# Patient Record
Sex: Male | Born: 1970
Health system: Southern US, Community
[De-identification: ages and names within clinical notes are randomized; demographics above are authoritative.]

## PROBLEM LIST (undated history)

## (undated) DIAGNOSIS — Y249XXA Unspecified firearm discharge, undetermined intent, initial encounter: Secondary | ICD-10-CM

## (undated) DIAGNOSIS — I219 Acute myocardial infarction, unspecified: Secondary | ICD-10-CM

## (undated) DIAGNOSIS — I10 Essential (primary) hypertension: Secondary | ICD-10-CM

## (undated) DIAGNOSIS — E119 Type 2 diabetes mellitus without complications: Secondary | ICD-10-CM

## (undated) DIAGNOSIS — W3400XA Accidental discharge from unspecified firearms or gun, initial encounter: Secondary | ICD-10-CM

## (undated) HISTORY — PX: ABDOMINAL SURGERY: SHX537

---

## 2019-08-16 ENCOUNTER — Encounter (HOSPITAL_COMMUNITY): Payer: Self-pay | Admitting: Emergency Medicine

## 2019-08-16 ENCOUNTER — Other Ambulatory Visit: Payer: Self-pay

## 2019-08-16 ENCOUNTER — Emergency Department (HOSPITAL_COMMUNITY)
Admission: EM | Admit: 2019-08-16 | Discharge: 2019-08-16 | Disposition: A | Discharge status: Home / Self Care | Payer: Self-pay | Attending: Emergency Medicine | Admitting: Emergency Medicine

## 2019-08-16 DIAGNOSIS — F17210 Nicotine dependence, cigarettes, uncomplicated: Secondary | ICD-10-CM | POA: Insufficient documentation

## 2019-08-16 DIAGNOSIS — Z79899 Other long term (current) drug therapy: Secondary | ICD-10-CM | POA: Insufficient documentation

## 2019-08-16 DIAGNOSIS — Z7982 Long term (current) use of aspirin: Secondary | ICD-10-CM | POA: Insufficient documentation

## 2019-08-16 DIAGNOSIS — Z760 Encounter for issue of repeat prescription: Secondary | ICD-10-CM | POA: Insufficient documentation

## 2019-08-16 DIAGNOSIS — Z91010 Allergy to peanuts: Secondary | ICD-10-CM | POA: Insufficient documentation

## 2019-08-16 DIAGNOSIS — I10 Essential (primary) hypertension: Secondary | ICD-10-CM | POA: Insufficient documentation

## 2019-08-16 DIAGNOSIS — R55 Syncope and collapse: Secondary | ICD-10-CM | POA: Insufficient documentation

## 2019-08-16 DIAGNOSIS — E119 Type 2 diabetes mellitus without complications: Secondary | ICD-10-CM | POA: Insufficient documentation

## 2019-08-16 DIAGNOSIS — Z7984 Long term (current) use of oral hypoglycemic drugs: Secondary | ICD-10-CM | POA: Insufficient documentation

## 2019-08-16 HISTORY — DX: Essential (primary) hypertension: I10

## 2019-08-16 HISTORY — DX: Type 2 diabetes mellitus without complications: E11.9

## 2019-08-16 LAB — URINALYSIS, ROUTINE W REFLEX MICROSCOPIC
Bilirubin Urine: NEGATIVE
Glucose, UA: NEGATIVE mg/dL
Hgb urine dipstick: NEGATIVE
Ketones, ur: NEGATIVE mg/dL
Leukocytes,Ua: NEGATIVE
Nitrite: NEGATIVE
Protein, ur: NEGATIVE mg/dL
Specific Gravity, Urine: 1.021 (ref 1.005–1.030)
pH: 5 (ref 5.0–8.0)

## 2019-08-16 LAB — BASIC METABOLIC PANEL
Anion gap: 8 (ref 5–15)
BUN: 10 mg/dL (ref 6–20)
CO2: 26 mmol/L (ref 22–32)
Calcium: 9 mg/dL (ref 8.9–10.3)
Chloride: 108 mmol/L (ref 98–111)
Creatinine, Ser: 0.7 mg/dL (ref 0.61–1.24)
GFR calc Af Amer: >60 mL/min (ref >60)
GFR calc non Af Amer: >60 mL/min (ref >60)
Glucose, Bld: 126 mg/dL — ABNORMAL HIGH (ref 70–99)
Potassium: 3.6 mmol/L (ref 3.5–5.1)
Sodium: 142 mmol/L (ref 135–145)

## 2019-08-16 LAB — CBC
HCT: 40.6 % (ref 39.0–52.0)
Hemoglobin: 13.1 g/dL (ref 13.0–17.0)
MCH: 29.1 pg (ref 26.0–34.0)
MCHC: 32.3 g/dL (ref 30.0–36.0)
MCV: 90.2 fL (ref 80.0–100.0)
Platelets: 241 10*3/uL (ref 150–400)
RBC: 4.5 MIL/uL (ref 4.22–5.81)
RDW: 13.2 % (ref 11.5–15.5)
WBC: 5.7 10*3/uL (ref 4.0–10.5)
nRBC: 0 % (ref 0.0–0.2)

## 2019-08-16 LAB — CBG MONITORING, ED: Glucose-Capillary: 129 mg/dL — ABNORMAL HIGH (ref 70–99)

## 2019-08-16 MED ORDER — GLIPIZIDE ER 10 MG PO TB24: 10.0000 mg | ORAL_TABLET | Freq: Every day | ORAL | 0 refills | Status: DC

## 2019-08-16 MED ORDER — METFORMIN HCL 500 MG PO TABS: 1000.0000 mg | ORAL_TABLET | Freq: Two times a day (BID) | ORAL | 0 refills | Status: DC

## 2019-08-16 MED ORDER — LOSARTAN POTASSIUM 25 MG PO TABS: 25.0000 mg | ORAL_TABLET | Freq: Every day | ORAL | 0 refills | Status: DC

## 2019-08-16 MED ORDER — METOPROLOL TARTRATE 25 MG PO TABS: 25.0000 mg | ORAL_TABLET | Freq: Every day | ORAL | 0 refills | Status: DC

## 2019-08-16 MED ORDER — ATORVASTATIN CALCIUM 80 MG PO TABS: 80.0000 mg | ORAL_TABLET | Freq: Every day | ORAL | 0 refills | Status: DC

## 2019-08-16 MED ORDER — SODIUM CHLORIDE 0.9% FLUSH
3.0000 mL | Freq: Once | INTRAVENOUS | Status: AC
  Administered 2019-08-16: 3 mL via INTRAVENOUS

## 2019-08-16 NOTE — Discharge Instructions (Signed)
I have refilled your medications.  Please go to pharmacy to have it refilled.  We have placed you in a MATCH program to help provide discount for your medication. Call and follow up with [facility] for further management of your health and reassessment of your near passing out episodes.  Your labs are normal today. Stay hydrated and take your medications as prescribed.

## 2019-08-16 NOTE — ED Provider Notes (Signed)
[place] DEPT Provider Note   CSN: [id number] Arrival date & time: 08/16/19  2035     History Chief Complaint  Patient presents with  . Near Syncope    Michael Tyler is a 49 y.o. male.  The history is provided by the patient. No language interpreter was used.  Near Syncope     49 year old male with history of hypertension, diabetes, presenting for evaluation of lightheadedness.  Patient report for the past 3 days each time he gets up from a laying to a sitting position he endorsed feeling dizzy.  He described dizziness felt like he is going to pass out.  Symptom lasting for few seconds and resolved however it has been recurrent which caused him noted to be concerned.  He also mentioned that he has been without his diabetic medication including glipizide and Metformin for the past 2 weeks.  He thought his symptoms may be due to his blood sugar.  He has been drinking plenty of fluid.  He denies any sick contact.  No fever or chills no headache, runny nose sneezing or coughing no chest pain or shortness of breath or abdominal pain.  He endorsed occasional lower back pain and states a few days ago he has some runny nose but denies any loss of taste or smell.  He is currently in rehab for alcohol abuse.  He denies any heart palpitation or missing meals.  Past Medical History:  Diagnosis Date  . Diabetes mellitus without complication ([place])   . Hypertension     There are no problems to display for this patient.   History reviewed. No pertinent surgical history.     History reviewed. No pertinent family history.  Social History   Tobacco Use  . Smoking status: Current Every Day Smoker    Types: Cigarettes  . Smokeless tobacco: Never Used  Substance Use Topics  . Alcohol use: Not Currently  . Drug use: Not Currently    Home Medications Prior to Admission medications   Medication Sig Start Date End Date Taking? Authorizing Provider  aspirin  81 MG EC tablet Take by mouth. 07/10/18  Yes [provider]  atorvastatin (LIPITOR) 80 MG tablet Take by mouth. 07/10/18  Yes [provider]  clopidogrel (PLAVIX) 75 MG tablet Take by mouth. 07/10/18  Yes [provider]  glipiZIDE (GLUCOTROL XL) 10 MG 24 hr tablet Take by mouth. 07/10/18  Yes [provider]  losartan (COZAAR) 25 MG tablet Take by mouth. 07/10/18  Yes [provider]  metFORMIN (GLUCOPHAGE-XR) 500 MG 24 hr tablet Take by mouth. 07/10/18  Yes [provider]  metoprolol tartrate (LOPRESSOR) 25 MG tablet Take by mouth. 07/10/18  Yes [provider]  naproxen (NAPROSYN) 500 MG tablet Take by mouth. 02/19/19  Yes [provider]  nitroGLYCERIN (NITROSTAT) 0.4 MG SL tablet Place under the tongue. 07/10/18  Yes [provider]    Allergies    Peanut oil and Penicillins  Review of Systems   Review of Systems  Cardiovascular: Positive for near-syncope.  All other systems reviewed and are negative.   Physical Exam Updated Vital Signs BP 136/86 (BP Location: Left Arm)   Pulse 66   Temp 98 F (36.7 C) (Oral)   Resp 18   Ht 5\' 10"  (1.778 m)   Wt 95.3 kg   SpO2 99%   BMI 30.13 kg/m   Physical Exam Vitals and nursing note reviewed.  Constitutional:      General: He  is not in acute distress.    Appearance: He is well-developed.  HENT:     Head: Atraumatic.  Eyes:     Extraocular Movements: Extraocular movements intact.     Conjunctiva/sclera: Conjunctivae normal.     Pupils: Pupils are equal, round, and reactive to light.  Cardiovascular:     Rate and Rhythm: Normal rate and regular rhythm.     Pulses: Normal pulses.     Heart sounds: Normal heart sounds.  Pulmonary:     Effort: Pulmonary effort is normal.     Breath sounds: Normal breath sounds.  Abdominal:     Palpations: Abdomen is soft.     Tenderness: There is no abdominal tenderness.  Musculoskeletal:     Cervical back:  Normal range of motion and neck supple.  Skin:    Findings: No rash.  Neurological:     Mental Status: He is alert and oriented to person, place, and time.     GCS: GCS eye subscore is 4. GCS verbal subscore is 5. GCS motor subscore is 6.     Motor: Motor function is intact.  Psychiatric:        Mood and Affect: Mood normal.        Speech: Speech normal.        Behavior: Behavior is cooperative.     ED Results / Procedures / Treatments   Labs (all labs ordered are listed, but only abnormal results are displayed) Labs Reviewed  BASIC METABOLIC PANEL - Abnormal; Notable for the following components:      Result Value   Glucose, Bld 126 (*)    All other components within normal limits  CBG MONITORING, ED - Abnormal; Notable for the following components:   Glucose-Capillary 129 (*)    All other components within normal limits  CBC  URINALYSIS, ROUTINE W REFLEX MICROSCOPIC    EKG EKG Interpretation  Date/Time:  Friday August 16 2019 20:56:30 EST Ventricular Rate:  69 PR Interval:    QRS Duration: 104 QT Interval:  427 QTC Calculation: 458 R Axis:   45 Text Interpretation: Sinus rhythm Confirmed by Donnetta Hutching ([id number]) on 08/16/2019 9:46:56 PM    Date: 08/16/2019  Rate: 69  Rhythm: normal sinus rhythm  QRS Axis: normal  Intervals: normal  ST/T Wave abnormalities: normal  Conduction Disutrbances: none  Narrative Interpretation:   Old EKG Reviewed: No significant changes noted     Radiology No results found.  Procedures Procedures (including critical care time)  Medications Ordered in ED Medications  sodium chloride flush (NS) 0.9 % injection 3 mL (3 mLs Intravenous Given by Other 08/16/19 2114)    ED Course  I have reviewed the triage vital signs and the nursing notes.  Pertinent labs & imaging results that were available during my care of the patient were reviewed by me and considered in my medical decision making (see chart for details).    MDM  Rules/Calculators/A&P                      BP 136/86 (BP Location: Left Arm)   Pulse 66   Temp 98 F (36.7 C) (Oral)   Resp 18   Ht 5\' 10"  (1.778 m)   Wt 95.3 kg   SpO2 99%   BMI 30.13 kg/m   Final Clinical Impression(s) / ED Diagnoses Final diagnoses:  Near syncope  Medication refill    Rx / DC Orders ED Discharge Orders  Ordered    atorvastatin (LIPITOR) 80 MG tablet  Daily-1800     08/16/19 2246    glipiZIDE (GLUCOTROL XL) 10 MG 24 hr tablet  Daily with breakfast     08/16/19 2246    losartan (COZAAR) 25 MG tablet  Daily     08/16/19 2246    metFORMIN (GLUCOPHAGE) 500 MG tablet  2 times daily with meals     08/16/19 2246    metoprolol tartrate (LOPRESSOR) 25 MG tablet  Daily     08/16/19 2246         9:39 PM Patient here for complaints of near syncope.  He is usually brought on by positional change.  He is well-appearing, vital signs stable, EKG without concerning arrhythmia or ischemic changes, hemoglobin is within normal limit, UA without signs of urine tract infection, will check orthostatic vital sign.  9:49 PM Patient has normal orthostatic vital sign.  He does not have any Covid symptoms.  He has no significant risk factor for PE.  He does not have any room spinning sensation to suggest BPPV.  He is well-appearing, ambulate without difficulty.  No focal neuro deficit on exam.  Encourage outpatient follow-up for evaluation.  Return precaution discussed.  He did request for medication refill of his diabetic medication.  Patient also report he does not have his Lipitor, Cozaar, and Lopressor.  He does have Plavix.  I have reached out and talk to his case manager who was able to set patient up with community health and wellness for close follow-up as well as helping match with program to get affordable medication.  Patient otherwise stable for discharge.  Return precaution discussed.   Fayrene Helper, PA-C 08/16/19 2310    Donnetta Hutching, MD 08/17/19 [phone]

## 2019-08-16 NOTE — Care Management (Signed)
  MATCH Medication Assistance Card Name: Jabril Pursell  ID (MRN): [id number] Bin: [id number] RX Group: [id number] Discharge Date: 08/16/2019 Expiration Date: 08/31/2019                                           (must be filled within 7 days of discharge)        Dear   : Gwendlyn Deutscher   You have been approved to have the prescriptions written by your discharging physician filled through our [facility] (Medication Assistance Through [facility]) program. This program allows for a one-time (no refills) 34-day supply of selected medications for a low copay amount.  The copay is $3.00 per prescription. For instance, if you have one prescription, you will pay $3.00; for two prescriptions, you pay $6.00; for three prescriptions, you pay $9.00; and so on.  Only certain pharmacies are participating in this program with [facility]. You will need to select one of the pharmacies from the attached list and take your prescriptions, this letter, and your photo ID to one of the participating pharmacies.   We are excited that you are able to use the [facility] program to get your medications. These prescriptions must be filled within 7 days of hospital discharge or they will no longer be valid for the [facility] program. Should you have any problems with your prescriptions please contact your case management team member at [phone] for [place]/ [facility].  Thank you, [facility] Care Management

## 2019-08-16 NOTE — Care Management (Signed)
ED CM consulted for Medication assistance with MATCH.  Patient is uninsured and cannot afford medications. CM enrolled patient, prescriptions were escribed to the [facility] [address].  CM faxed Match letter to [facility] on [address] and notified pharmacy but they were unable to check receipt at this time. CM will follow up with pharmacy in the am

## 2019-08-16 NOTE — ED Triage Notes (Signed)
Patient is complaining of dizziness and feeling woozy. Patient states that it started three days ago. Patient states it is worse when he stands up.

## 2019-08-20 ENCOUNTER — Other Ambulatory Visit: Payer: Self-pay

## 2019-08-20 ENCOUNTER — Encounter (HOSPITAL_COMMUNITY): Payer: Self-pay | Admitting: Emergency Medicine

## 2019-08-20 ENCOUNTER — Emergency Department (HOSPITAL_COMMUNITY)
Admission: EM | Admit: 2019-08-20 | Discharge: 2019-08-20 | Disposition: A | Discharge status: Home / Self Care | Payer: Self-pay | Attending: Emergency Medicine | Admitting: Emergency Medicine

## 2019-08-20 ENCOUNTER — Emergency Department (HOSPITAL_COMMUNITY): Payer: Self-pay

## 2019-08-20 DIAGNOSIS — F17210 Nicotine dependence, cigarettes, uncomplicated: Secondary | ICD-10-CM | POA: Insufficient documentation

## 2019-08-20 DIAGNOSIS — E119 Type 2 diabetes mellitus without complications: Secondary | ICD-10-CM | POA: Insufficient documentation

## 2019-08-20 DIAGNOSIS — R519 Headache, unspecified: Secondary | ICD-10-CM

## 2019-08-20 DIAGNOSIS — U071 COVID-19: Secondary | ICD-10-CM | POA: Insufficient documentation

## 2019-08-20 DIAGNOSIS — I10 Essential (primary) hypertension: Secondary | ICD-10-CM | POA: Insufficient documentation

## 2019-08-20 LAB — CBC WITH DIFFERENTIAL/PLATELET
Abs Immature Granulocytes: 0.01 10*3/uL (ref 0.00–0.07)
Basophils Absolute: 0 10*3/uL (ref 0.0–0.1)
Basophils Relative: 1 %
Eosinophils Absolute: 0.1 10*3/uL (ref 0.0–0.5)
Eosinophils Relative: 2 %
HCT: 41.4 % (ref 39.0–52.0)
Hemoglobin: 13.3 g/dL (ref 13.0–17.0)
Immature Granulocytes: 0 %
Lymphocytes Relative: 43 %
Lymphs Abs: 1.9 10*3/uL (ref 0.7–4.0)
MCH: 28.7 pg (ref 26.0–34.0)
MCHC: 32.1 g/dL (ref 30.0–36.0)
MCV: 89.4 fL (ref 80.0–100.0)
Monocytes Absolute: 0.8 10*3/uL (ref 0.1–1.0)
Monocytes Relative: 18 %
Neutro Abs: 1.6 10*3/uL — ABNORMAL LOW (ref 1.7–7.7)
Neutrophils Relative %: 36 %
Platelets: 204 10*3/uL (ref 150–400)
RBC: 4.63 MIL/uL (ref 4.22–5.81)
RDW: 13.6 % (ref 11.5–15.5)
WBC: 4.3 10*3/uL (ref 4.0–10.5)
nRBC: 0 % (ref 0.0–0.2)

## 2019-08-20 LAB — BASIC METABOLIC PANEL
Anion gap: 6 (ref 5–15)
BUN: 7 mg/dL (ref 6–20)
CO2: 26 mmol/L (ref 22–32)
Calcium: 8.9 mg/dL (ref 8.9–10.3)
Chloride: 109 mmol/L (ref 98–111)
Creatinine, Ser: 0.66 mg/dL (ref 0.61–1.24)
GFR calc Af Amer: >60 mL/min (ref >60)
GFR calc non Af Amer: >60 mL/min (ref >60)
Glucose, Bld: 170 mg/dL — ABNORMAL HIGH (ref 70–99)
Potassium: 3.8 mmol/L (ref 3.5–5.1)
Sodium: 141 mmol/L (ref 135–145)

## 2019-08-20 LAB — POC SARS CORONAVIRUS 2 AG -  ED: SARS Coronavirus 2 Ag: POSITIVE — AB

## 2019-08-20 LAB — TROPONIN I (HIGH SENSITIVITY)
Troponin I (High Sensitivity): <2 ng/L (ref <18)
Troponin I (High Sensitivity): 3 ng/L (ref <18)

## 2019-08-20 NOTE — ED Triage Notes (Signed)
Pt reports that he was exposed to Covid. For couple days having: dry mouth, headache, back pains, dizziness.  Pt adds chest pain

## 2019-08-20 NOTE — Discharge Instructions (Signed)
Person Under Monitoring Name: Michael Tyler  Location: [address] [place] [address]   Infection Prevention Recommendations for Individuals Confirmed to have, or Being Evaluated for, 2019 Novel Coronavirus (COVID-19) Infection Who Receive Care at Home  Individuals who are confirmed to have, or are being evaluated for, COVID-19 should follow the prevention steps below until a healthcare provider or local or state health department says they can return to normal activities.  Stay home except to get medical care You should restrict activities outside your home, except for getting medical care. Do not go to work, school, or public areas, and do not use public transportation or taxis.  Call ahead before visiting your doctor Before your medical appointment, call the healthcare provider and tell them that you have, or are being evaluated for, COVID-19 infection. This will help the healthcare provider's office take steps to keep other people from getting infected. Ask your healthcare provider to call the local or state health department.  Monitor your symptoms Seek prompt medical attention if your illness is worsening (e.g., difficulty breathing). Before going to your medical appointment, call the healthcare provider and tell them that you have, or are being evaluated for, COVID-19 infection. Ask your healthcare provider to call the local or state health department.  Wear a facemask You should wear a facemask that covers your nose and mouth when you are in the same room with other people and when you visit a healthcare provider. People who live with or visit you should also wear a facemask while they are in the same room with you.  Separate yourself from other people in your home As much as possible, you should stay in a different room from other people in your home. Also, you should use a separate bathroom, if available.  Avoid sharing household items You should not  share dishes, drinking glasses, cups, eating utensils, towels, bedding, or other items with other people in your home. After using these items, you should wash them thoroughly with soap and water.  Cover your coughs and sneezes Cover your mouth and nose with a tissue when you cough or sneeze, or you can cough or sneeze into your sleeve. Throw used tissues in a lined trash can, and immediately wash your hands with soap and water for at least 20 seconds or use an alcohol-based hand rub.  Wash your [employer] your hands often and thoroughly with soap and water for at least 20 seconds. You can use an alcohol-based hand sanitizer if soap and water are not available and if your hands are not visibly dirty. Avoid touching your eyes, nose, and mouth with unwashed hands.   Prevention Steps for Caregivers and Household Members of Individuals Confirmed to have, or Being Evaluated for, COVID-19 Infection Being Cared for in the Home  If you live with, or provide care at home for, a person confirmed to have, or being evaluated for, COVID-19 infection please follow these guidelines to prevent infection:  Follow healthcare provider's instructions Make sure that you understand and can help the patient follow any healthcare provider instructions for all care.  Provide for the patient's basic needs You should help the patient with basic needs in the home and provide support for getting groceries, prescriptions, and other personal needs.  Monitor the patient's symptoms If they are getting sicker, call his or her medical provider and tell them that the patient has, or is being evaluated for, COVID-19 infection. This will help the healthcare provider's  office take steps to keep other people from getting infected. Ask the healthcare provider to call the local or state health department.  Limit the number of people who have contact with the patient If possible, have only one caregiver for the  patient. Other household members should stay in another home or place of residence. If this is not possible, they should stay in another room, or be separated from the patient as much as possible. Use a separate bathroom, if available. Restrict visitors who do not have an essential need to be in the home.  Keep older adults, very young children, and other sick people away from the patient Keep older adults, very young children, and those who have compromised immune systems or chronic health conditions away from the patient. This includes people with chronic heart, lung, or kidney conditions, diabetes, and cancer.  Ensure good ventilation Make sure that shared spaces in the home have good air flow, such as from an air conditioner or an opened window, weather permitting.  Wash your hands often Wash your hands often and thoroughly with soap and water for at least 20 seconds. You can use an alcohol based hand sanitizer if soap and water are not available and if your hands are not visibly dirty. Avoid touching your eyes, nose, and mouth with unwashed hands. Use disposable paper towels to dry your hands. If not available, use dedicated cloth towels and replace them when they become wet.  Wear a facemask and gloves Wear a disposable facemask at all times in the room and gloves when you touch or have contact with the patient's blood, body fluids, and/or secretions or excretions, such as sweat, saliva, sputum, nasal mucus, vomit, urine, or feces.  Ensure the mask fits over your nose and mouth tightly, and do not touch it during use. Throw out disposable facemasks and gloves after using them. Do not reuse. Wash your hands immediately after removing your facemask and gloves. If your personal clothing becomes contaminated, carefully remove clothing and launder. Wash your hands after handling contaminated clothing. Place all used disposable facemasks, gloves, and other waste in a lined container before  disposing them with other household waste. Remove gloves and wash your hands immediately after handling these items.  Do not share dishes, glasses, or other household items with the patient Avoid sharing household items. You should not share dishes, drinking glasses, cups, eating utensils, towels, bedding, or other items with a patient who is confirmed to have, or being evaluated for, COVID-19 infection. After the person uses these items, you should wash them thoroughly with soap and water.  Wash laundry thoroughly Immediately remove and wash clothes or bedding that have blood, body fluids, and/or secretions or excretions, such as sweat, saliva, sputum, nasal mucus, vomit, urine, or feces, on them. Wear gloves when handling laundry from the patient. Read and follow directions on labels of laundry or clothing items and detergent. In general, wash and dry with the warmest temperatures recommended on the label.  Clean all areas the individual has used often Clean all touchable surfaces, such as counters, tabletops, doorknobs, bathroom fixtures, toilets, phones, keyboards, tablets, and bedside tables, every day. Also, clean any surfaces that may have blood, body fluids, and/or secretions or excretions on them. Wear gloves when cleaning surfaces the patient has come in contact with. Use a diluted bleach solution (e.g., dilute bleach with 1 part bleach and 10 parts water) or a household disinfectant with a label that says EPA-registered for coronaviruses. To make a  bleach solution at home, add 1 tablespoon of bleach to 1 quart (4 cups) of water. For a larger supply, add  cup of bleach to 1 gallon (16 cups) of water. Read labels of cleaning products and follow recommendations provided on product labels. Labels contain instructions for safe and effective use of the cleaning product including precautions you should take when applying the product, such as wearing gloves or eye protection and making sure you  have good ventilation during use of the product. Remove gloves and wash hands immediately after cleaning.  Monitor yourself for signs and symptoms of illness Caregivers and household members are considered close contacts, should monitor their health, and will be asked to limit movement outside of the home to the extent possible. Follow the monitoring steps for close contacts listed on the symptom monitoring form.   ? If you have additional questions, contact your local health department or call the epidemiologist on call at [phone] (available 24/7). ? This guidance is subject to change. For the most up-to-date guidance from [facility], please refer to their website: YouBlogs.pl

## 2019-08-20 NOTE — ED Provider Notes (Signed)
Emergency Department Provider Note   I have reviewed the triage vital signs and the nursing notes.   HISTORY  Chief Complaint Headache, Dizziness, and Covid exposure   HPI Michael Tyler is a 49 y.o. male with PMH of HTN and DM presents to the emergency department for evaluation after possible COVID-19 exposure.  Patient has developed dry mouth, headache, body aches over the past 2 days.  He was exposed last week to COVID-19 after someone he was in contact with tested positive.  He has developed some diffuse, constant chest pain over the past 2 days as well.  He states that the pain is constant and nonradiating.  He denies shortness of breath.  He has not recorded fever at home.  Past Medical History:  Diagnosis Date  . Diabetes mellitus without complication (HCC)   . Hypertension     There are no problems to display for this patient.   History reviewed. No pertinent surgical history.  Allergies Peanut oil and Penicillins  No family history on file.  Social History Social History   Tobacco Use  . Smoking status: Current Every Day Smoker    Types: Cigarettes  . Smokeless tobacco: Never Used  Substance Use Topics  . Alcohol use: Not Currently  . Drug use: Not Currently    Review of Systems  Constitutional: No fever/chills. Positive fatigue and body aches. Positive dizzy feelings.  Eyes: No visual changes. ENT: No sore throat. Cardiovascular: Positive chest pain. Respiratory: Denies shortness of breath. Positive cough.  Gastrointestinal: No abdominal pain.  No nausea, no vomiting.  No diarrhea.  No constipation. Genitourinary: Negative for dysuria. Musculoskeletal: Negative for back pain. Skin: Negative for rash. Neurological: Negative for focal weakness or numbness. Positive HA.   10-point ROS otherwise negative.  ____________________________________________   PHYSICAL EXAM:  VITAL SIGNS: ED Triage Vitals  Enc Vitals Group     BP 08/20/19 1137 (!)  132/95     Pulse Rate 08/20/19 1137 74     Resp 08/20/19 1137 17     Temp 08/20/19 1137 98.3 F (36.8 C)     Temp Source 08/20/19 1137 Oral     SpO2 08/20/19 1137 99 %   Constitutional: Alert and oriented. Well appearing and in no acute distress. Eyes: Conjunctivae are normal.  Head: Atraumatic. Nose: No congestion/rhinnorhea. Mouth/Throat: Mucous membranes are moist.   Neck: No stridor.  Cardiovascular: Normal rate, regular rhythm. Good peripheral circulation. Grossly normal heart sounds.   Respiratory: Normal respiratory effort.  No retractions. Lungs CTAB. Gastrointestinal: No distention.  Musculoskeletal: No gross deformities of extremities. Neurologic:  Normal speech and language.  Skin:  Skin is warm, dry and intact. No rash noted.   ____________________________________________   LABS (all labs ordered are listed, but only abnormal results are displayed)  Labs Reviewed  BASIC METABOLIC PANEL - Abnormal; Notable for the following components:      Result Value   Glucose, Bld 170 (*)    All other components within normal limits  CBC WITH DIFFERENTIAL/PLATELET - Abnormal; Notable for the following components:   Neutro Abs 1.6 (*)    All other components within normal limits  POC SARS CORONAVIRUS 2 AG -  ED - Abnormal; Notable for the following components:   SARS Coronavirus 2 Ag POSITIVE (*)    All other components within normal limits  TROPONIN I (HIGH SENSITIVITY)  TROPONIN I (HIGH SENSITIVITY)   ____________________________________________  EKG  Rate: 74 PR: 150 QTc: 435  NSR. Narrow QRS. No  ST elevation or depression. Normal T waves. No STEMI.  ____________________________________________  RADIOLOGY  DG Chest 1 View  Result Date: 08/20/2019 CLINICAL DATA:  Headache, dizziness, COVID exposure EXAM: CHEST  1 VIEW COMPARISON:  None. FINDINGS: The heart size and mediastinal contours are within normal limits. Both lungs are clear. No pleural effusion. The  visualized skeletal structures are unremarkable. Metallic fragments overlie the inferior right lateral chest wall. IMPRESSION: No acute process in the chest. Electronically Signed   By: Macy Mis M.D.   On: 08/20/2019 12:00    ____________________________________________   PROCEDURES  Procedure(s) performed:   Procedures  None  ____________________________________________   INITIAL IMPRESSION / ASSESSMENT AND PLAN / ED COURSE  Pertinent labs & imaging results that were available during my care of the patient were reviewed by me and considered in my medical decision making (see chart for details).   Patient presents to the emergency department for evaluation of COVID-19-like symptoms including dry mouth, headache, body aches.  He has developed some chest discomfort which sound more consistent with body aches rather than acute ischemic pain.  Doubt PE clinically.  Vital signs are unremarkable.  No hypoxemia.  EKG reviewed with no ischemic changes. His troponin is negative x1.  Chest x-ray clear.   Plan for quarantine at home and strict ED return precautions.  ____________________________________________  FINAL CLINICAL IMPRESSION(S) / ED DIAGNOSES  Final diagnoses:  COVID-19    Note:  This document was prepared using Dragon voice recognition software and may include unintentional dictation errors.  Nanda Quinton, MD, [facility]    Jasin Brazel, [facility], MD 08/21/19 [phone]

## 2019-08-21 ENCOUNTER — Telehealth: Payer: Self-pay | Admitting: Pulmonary Disease

## 2019-08-21 NOTE — Telephone Encounter (Signed)
08/21/2019    I attempted to reach the patient regarding recent Covid diagnosis as well as opportunity for monoclonal antibody infusion.  It does look like the patient would qualify based off of history of Diabetes (med list has glipizide).  I have left a message with the telephone number: [phone] for the patient to call us back if they are interested.   Coral Ceo, NP

## 2019-10-03 ENCOUNTER — Emergency Department (HOSPITAL_COMMUNITY)
Admission: EM | Admit: 2019-10-03 | Discharge: 2019-10-03 | Disposition: A | Discharge status: Home / Self Care | Payer: Self-pay | Attending: Emergency Medicine | Admitting: Emergency Medicine

## 2019-10-03 ENCOUNTER — Emergency Department (HOSPITAL_COMMUNITY): Payer: Self-pay

## 2019-10-03 ENCOUNTER — Other Ambulatory Visit: Payer: Self-pay

## 2019-10-03 ENCOUNTER — Encounter (HOSPITAL_COMMUNITY): Payer: Self-pay | Admitting: *Deleted

## 2019-10-03 DIAGNOSIS — I10 Essential (primary) hypertension: Secondary | ICD-10-CM | POA: Insufficient documentation

## 2019-10-03 DIAGNOSIS — E119 Type 2 diabetes mellitus without complications: Secondary | ICD-10-CM | POA: Insufficient documentation

## 2019-10-03 DIAGNOSIS — F17210 Nicotine dependence, cigarettes, uncomplicated: Secondary | ICD-10-CM | POA: Insufficient documentation

## 2019-10-03 DIAGNOSIS — Z91010 Allergy to peanuts: Secondary | ICD-10-CM | POA: Insufficient documentation

## 2019-10-03 DIAGNOSIS — M5441 Lumbago with sciatica, right side: Secondary | ICD-10-CM | POA: Insufficient documentation

## 2019-10-03 DIAGNOSIS — I2510 Atherosclerotic heart disease of native coronary artery without angina pectoris: Secondary | ICD-10-CM | POA: Insufficient documentation

## 2019-10-03 LAB — HEMOGLOBIN A1C
Hgb A1c MFr Bld: 8.4 % — ABNORMAL HIGH (ref 4.8–5.6)
Mean Plasma Glucose: 194.38 mg/dL

## 2019-10-03 LAB — I-STAT CHEM 8, ED
BUN: 5 mg/dL — ABNORMAL LOW (ref 6–20)
Calcium, Ion: 1.22 mmol/L (ref 1.15–1.40)
Chloride: 107 mmol/L (ref 98–111)
Creatinine, Ser: 0.7 mg/dL (ref 0.61–1.24)
Glucose, Bld: 137 mg/dL — ABNORMAL HIGH (ref 70–99)
HCT: 38 % — ABNORMAL LOW (ref 39.0–52.0)
Hemoglobin: 12.9 g/dL — ABNORMAL LOW (ref 13.0–17.0)
Potassium: 4 mmol/L (ref 3.5–5.1)
Sodium: 144 mmol/L (ref 135–145)
TCO2: 26 mmol/L (ref 22–32)

## 2019-10-03 MED ORDER — LIDOCAINE 5 % EX PTCH: 1.0000 | MEDICATED_PATCH | CUTANEOUS | 0 refills | Status: DC

## 2019-10-03 MED ORDER — LOSARTAN POTASSIUM 25 MG PO TABS: 25.0000 mg | ORAL_TABLET | Freq: Every day | ORAL | 0 refills | Status: DC

## 2019-10-03 MED ORDER — ATORVASTATIN CALCIUM 80 MG PO TABS: 80.0000 mg | ORAL_TABLET | Freq: Every day | ORAL | 0 refills | Status: DC

## 2019-10-03 MED ORDER — IBUPROFEN 200 MG PO TABS
600.0000 mg | ORAL_TABLET | Freq: Once | ORAL | Status: AC
  Administered 2019-10-03: 600 mg via ORAL
  Filled 2019-10-03: qty 3

## 2019-10-03 MED ORDER — PREDNISONE 10 MG PO TABS: 40.0000 mg | ORAL_TABLET | Freq: Every day | ORAL | 0 refills | Status: DC

## 2019-10-03 MED ORDER — GLIPIZIDE ER 10 MG PO TB24: 10.0000 mg | ORAL_TABLET | Freq: Every day | ORAL | 0 refills | Status: DC

## 2019-10-03 MED ORDER — CLOPIDOGREL BISULFATE 75 MG PO TABS: 75.0000 mg | ORAL_TABLET | Freq: Every day | ORAL | 0 refills | Status: DC

## 2019-10-03 MED ORDER — METFORMIN HCL 500 MG PO TABS: 1000.0000 mg | ORAL_TABLET | Freq: Two times a day (BID) | ORAL | 0 refills | Status: DC

## 2019-10-03 MED ORDER — METOPROLOL TARTRATE 25 MG PO TABS: 25.0000 mg | ORAL_TABLET | Freq: Every day | ORAL | 0 refills | Status: DC

## 2019-10-03 MED ORDER — ACETAMINOPHEN 325 MG PO TABS
650.0000 mg | ORAL_TABLET | Freq: Once | ORAL | Status: AC
  Administered 2019-10-03: 650 mg via ORAL
  Filled 2019-10-03: qty 2

## 2019-10-03 MED ORDER — LIDOCAINE 5 % EX PTCH
1.0000 | MEDICATED_PATCH | CUTANEOUS | Status: DC
  Administered 2019-10-03: 1 via TRANSDERMAL
  Filled 2019-10-03: qty 1

## 2019-10-03 MED ORDER — ASPIRIN 81 MG PO TBEC: 81.0000 mg | DELAYED_RELEASE_TABLET | Freq: Every day | ORAL | 0 refills | Status: DC

## 2019-10-03 MED FILL — ?METOPROLOL TART 25MG TABLE: 25 | 30 days supply | Qty: 30 | Fill #0

## 2019-10-03 MED FILL — glipiZIDE XL 10 MG TB24: 10 | 30 days supply | Qty: 30 | Fill #0

## 2019-10-03 MED FILL — ?LIDOCAINE 5% PATCH: 5 | 30 days supply | Qty: 30 | Fill #0

## 2019-10-03 MED FILL — ?METFORMIN HCL 500MG TABLET: 500 | 30 days supply | Qty: 120 | Fill #0

## 2019-10-03 MED FILL — ?LOSARTAN POTASSIUM 25MG: 25 | 30 days supply | Qty: 30 | Fill #0

## 2019-10-03 MED FILL — ATORVASTATIN 80 MG TABLET: 80 | 30 days supply | Qty: 30 | Fill #0

## 2019-10-03 MED FILL — CLOPIDOGREL 75 MG TABLET: 75 | 30 days supply | Qty: 30 | Fill #0

## 2019-10-03 MED FILL — ?PREDINSONE 10MG TABLETS: 10 | 5 days supply | Qty: 20 | Fill #0
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
## 2019-10-03 NOTE — TOC Initial Note (Addendum)
Transition of Care [facility]) - Initial/Assessment Note    Patient Details  Name: Michael Tyler MRN: [id number] Date of Birth: [birth year]  [facility] CM/SW Contact:    Elliot Cousin, RN Phone Number: [phone] 10/03/2019, 12:20 PM  Clinical Narrative:                  TOC CM spoke to pt and states he is currently living at [facility] [place] for past five months. States he recently started a new job and his funds are low. Appt arranged at [facility] and pt can utilize [facility] pharmacy to pick up his meds for a discounted rate. Explained meds are $4 or $10. Appt arranged for [facility] for 11/05/2019 at 8:50 am. States [facility] [place] will arrange transportation for him to his appt. Provided pt with brochure for [facility] with appt time.        Patient Goals and CMS Choice        Expected Discharge Plan and Services   In-house Referral: Clinical Social Work Discharge Planning Services: CM Consult, Indigent Health Clinic, Medication Assistance, Follow-up appt scheduled                                          Prior Living Arrangements/Services     Patient language and need for interpreter reviewed:: Yes Do you feel safe going back to the place where you live?: Yes      Need for Family Participation in Patient Care: No (Comment) Care giver support system in place?: No (comment)   Criminal Activity/Legal Involvement Pertinent to Current Situation/Hospitalization: No - Comment as needed  Activities of Daily Living      Permission Sought/Granted Permission sought to share information with : Case Manager, PCP, Family Supports Permission granted to share information with : Yes, Verbal Permission Granted  Share Information with NAME: Robbie Lis  Permission granted to share info w AGENCY: [facility]  Permission granted to share info w Relationship: friend  Permission granted to share info w Contact Information: [phone]  Emotional Assessment    Attitude/Demeanor/Rapport: Gracious Affect (typically observed): Accepting Orientation: : Oriented to Self, Oriented to Place, Oriented to Situation, Oriented to  Time Alcohol / Substance Use: Tobacco Use Psych Involvement: No (comment)  Admission diagnosis:  Back Pain There are no problems to display for this patient.  PCP:  Patient, No Pcp Per Pharmacy:   [facility] - [place], [place] - [place] [address] [place] [employer] [place] [address] Phone: [phone] Fax: [phone]     Social Determinants of Health (SDOH) Interventions    Readmission Risk Interventions No flowsheet data found.

## 2019-10-03 NOTE — ED Triage Notes (Signed)
Loer back pain across back for 3 gases, Denies constipation or urinary symptoms, states he has a lot of gas.

## 2019-10-03 NOTE — ED Notes (Signed)
Patient transported to XR. 

## 2019-11-05 ENCOUNTER — Ambulatory Visit: Payer: Self-pay | Admitting: Family Medicine

## 2019-12-05 ENCOUNTER — Emergency Department (HOSPITAL_COMMUNITY): Payer: Self-pay

## 2019-12-05 ENCOUNTER — Emergency Department (HOSPITAL_COMMUNITY)
Admission: EM | Admit: 2019-12-05 | Discharge: 2019-12-05 | Disposition: A | Discharge status: Home / Self Care | Payer: Self-pay | Attending: Emergency Medicine | Admitting: Emergency Medicine

## 2019-12-05 ENCOUNTER — Other Ambulatory Visit: Payer: Self-pay

## 2019-12-05 DIAGNOSIS — R0789 Other chest pain: Secondary | ICD-10-CM | POA: Insufficient documentation

## 2019-12-05 DIAGNOSIS — Z79899 Other long term (current) drug therapy: Secondary | ICD-10-CM | POA: Insufficient documentation

## 2019-12-05 DIAGNOSIS — F17210 Nicotine dependence, cigarettes, uncomplicated: Secondary | ICD-10-CM | POA: Insufficient documentation

## 2019-12-05 DIAGNOSIS — I10 Essential (primary) hypertension: Secondary | ICD-10-CM | POA: Insufficient documentation

## 2019-12-05 DIAGNOSIS — Z7982 Long term (current) use of aspirin: Secondary | ICD-10-CM | POA: Insufficient documentation

## 2019-12-05 DIAGNOSIS — Z7984 Long term (current) use of oral hypoglycemic drugs: Secondary | ICD-10-CM | POA: Insufficient documentation

## 2019-12-05 DIAGNOSIS — Z91010 Allergy to peanuts: Secondary | ICD-10-CM | POA: Insufficient documentation

## 2019-12-05 DIAGNOSIS — R079 Chest pain, unspecified: Secondary | ICD-10-CM

## 2019-12-05 DIAGNOSIS — E119 Type 2 diabetes mellitus without complications: Secondary | ICD-10-CM | POA: Insufficient documentation

## 2019-12-05 LAB — BASIC METABOLIC PANEL
Anion gap: 10 (ref 5–15)
BUN: 10 mg/dL (ref 6–20)
CO2: 25 mmol/L (ref 22–32)
Calcium: 8.6 mg/dL — ABNORMAL LOW (ref 8.9–10.3)
Chloride: 103 mmol/L (ref 98–111)
Creatinine, Ser: 0.87 mg/dL (ref 0.61–1.24)
GFR calc Af Amer: >60 mL/min (ref >60)
GFR calc non Af Amer: >60 mL/min (ref >60)
Glucose, Bld: 370 mg/dL — ABNORMAL HIGH (ref 70–99)
Potassium: 4.1 mmol/L (ref 3.5–5.1)
Sodium: 138 mmol/L (ref 135–145)

## 2019-12-05 LAB — CBC
HCT: 38.6 % — ABNORMAL LOW (ref 39.0–52.0)
Hemoglobin: 12.3 g/dL — ABNORMAL LOW (ref 13.0–17.0)
MCH: 28.7 pg (ref 26.0–34.0)
MCHC: 31.9 g/dL (ref 30.0–36.0)
MCV: 90.2 fL (ref 80.0–100.0)
Platelets: 247 10*3/uL (ref 150–400)
RBC: 4.28 MIL/uL (ref 4.22–5.81)
RDW: 14.6 % (ref 11.5–15.5)
WBC: 6.4 10*3/uL (ref 4.0–10.5)
nRBC: 0 % (ref 0.0–0.2)

## 2019-12-05 LAB — PROTIME-INR
INR: 1 (ref 0.8–1.2)
Prothrombin Time: 12.7 seconds (ref 11.4–15.2)

## 2019-12-05 LAB — TROPONIN I (HIGH SENSITIVITY)
Troponin I (High Sensitivity): 3 ng/L (ref <18)
Troponin I (High Sensitivity): 2 ng/L (ref <18)

## 2019-12-05 MED ORDER — METOPROLOL TARTRATE 25 MG PO TABS: 25.0000 mg | ORAL_TABLET | Freq: Every day | ORAL | 0 refills | Status: DC

## 2019-12-05 MED ORDER — ATORVASTATIN CALCIUM 80 MG PO TABS: 80.0000 mg | ORAL_TABLET | Freq: Every day | ORAL | 0 refills | Status: DC

## 2019-12-05 MED ORDER — CLOPIDOGREL BISULFATE 75 MG PO TABS: 75.0000 mg | ORAL_TABLET | Freq: Every day | ORAL | 0 refills | Status: DC

## 2019-12-05 MED ORDER — ASPIRIN 81 MG PO TBEC: 81.0000 mg | DELAYED_RELEASE_TABLET | Freq: Every day | ORAL | 0 refills | Status: DC

## 2019-12-05 MED ORDER — LOSARTAN POTASSIUM 25 MG PO TABS: 25.0000 mg | ORAL_TABLET | Freq: Every day | ORAL | 0 refills | Status: DC

## 2019-12-05 MED ORDER — GLIPIZIDE ER 10 MG PO TB24: 10.0000 mg | ORAL_TABLET | Freq: Every day | ORAL | 0 refills | Status: DC

## 2019-12-05 MED FILL — glipiZIDE XL 10 MG TB24: 10 | 30 days supply | Qty: 30 | Fill #0

## 2019-12-05 MED FILL — ?METOPROLOL TART 25MG TABLE: 25 | 30 days supply | Qty: 30 | Fill #0

## 2019-12-05 MED FILL — ATORVASTATIN 80 MG TABLET: 80 | 30 days supply | Qty: 30 | Fill #0

## 2019-12-05 MED FILL — ?CLOPIDOGREL 75MG TA: 75 | 30 days supply | Qty: 30 | Fill #0

## 2019-12-05 MED FILL — LOSARTAN POTASSIUM 25 MG TA: 25 | 30 days supply | Qty: 30 | Fill #0
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
## 2019-12-05 NOTE — ED Triage Notes (Signed)
Pt arrives POV from home with complaints of chest tightness for 2 days. Pt reports he has a hx of stent placement. Pt endorses SHOB on exertion. Pt reports he has been taking " heart medication and blood thinners but ran out yesterday"

## 2019-12-05 NOTE — Progress Notes (Addendum)
TOC CM spoke to pt and states he still lives at [facility] [place]. They will assist him with transportation to his appt. Provided pt with [facility] brochure with address. Explained he can utilize the pharmacy to pick up meds for a $4 or $10 copay. He will need to speak to financial counselor to apply for assistance with his meds and PCP appts. Pt has utilized [facility] and also one time free fill at the pharmacy. Explained to pt to apply for assistance with the [facility] financial counselor to receive the [facility] Card for free meds if he qualifies. Appt arranged for 12/18/2019 at 3:10 pm.   Isidoro Donning RN CCM, [facility] ED TOC CM [phone]       12/05/19 1247  TOC ED Mini Assessment  TOC Time spent with patient (minutes): 30  PING Used in TOC Assessment No  Admission or Readmission Diverted Yes  Interventions which prevented an admission or readmission Medication Review;Follow-up medical appointment  What brought you to the Emergency Department?  chest pain  Barriers to Discharge Barriers Resolved  Barrier interventions assistance with meds, appt scheduled  Means of departure Car    12/05/2019 3:30 pm TOC CM call to pt and he did pick up meds. Explained that if he misses his appts more that 2x then the clinic can discharge him from coming. Pt was a no show at last appt. Pt states he will definitely try to make the appt this time. Isidoro Donning RN CCM, [facility] ED TOC CM [phone]

## 2019-12-05 NOTE — Discharge Instructions (Addendum)
[facility] the [facility] for the scheduled appointment with your primary care doctor.  The pharmacy service there has your prescriptions.

## 2019-12-16 NOTE — Progress Notes (Signed)
Patient ID: Michael Tyler, male   DOB: Jul 29, 1971, 49 y.o.   MRN: [id number]   Virtual Visit via Telephone Note  I connected with Alfred Levins on 12/18/19 at  3:10 PM EDT by telephone and verified that I am speaking with the correct person using two identifiers.   I discussed the limitations, risks, security and privacy concerns of performing an evaluation and management service by telephone and the availability of in person appointments. I also discussed with the patient that there may be a patient responsible charge related to this service. The patient expressed understanding and agreed to proceed.  PATIENT visit by telephone virtually in the context of Covid-19 pandemic. Patient location:  Home/halfway house My Location:  [facility] office Persons on the call:  Me and the patient  History of Present Illness: After ED visit 5/6 2021 for CP.  He had an MI 09/2018 and had a stent placed.  At that time he was using drugs.  He is now clean and sober and living in a half way house and trying to get back up to date on medications.  He moved to the half way house from [place].  Does not have insurance.  Needs help with financial assistance.  He has had sharp CP intermittently since his MI/stent placement and occasional SOB.  Denies leg or abdominal swelling.  A1C =8.4 09/2019   HPI He presents for evaluation of chest pain, which is described as "sharp," and onset yesterday, persistent today.  Pain does not wax and wane.  Pain is mild.  Pain is improved since he arrived here.  He is concerned, because he ran out of his cardiac medications, yesterday.  He has relocated to [place], from [place], [place].  He does not have a local cardiologist or PCP.  He states that he had a cardiac stent about 1 year ago, performed in [place], [place].  He states he is currently clean, and is residing in a halfway house.  He smokes cigarettes.  He denies use of any illegal drugs at this time.  He denies  fever, chills, cough, shortness of breath, nausea, vomiting, weakness or dizziness.  He admits to not being compliant with his diet for cardiac protection, and to avoid hypoglycemia.  He is currently taking his Metformin, 1000 mg, twice daily as prescribed.  There are no other known modifying factors.  Critical Interventions-clinical evaluation, laboratory testing, radiography, reassessment, consultation with transition of care team and arrangements made for follow-up primary care visit and a mechanism to get his prescriptions filled.   After These Interventions, the Patient was reevaluated and was found comfortable and nontoxic; evaluation consistent with nonspecific chest pain, with history of coronary disease.  Doubt ACS, PE or pneumonia.  Patient ran out of his medications yesterday, inadvertently, because he does not have a local prescribing physician.  He is stable for discharge with outpatient management.    Observations/Objective:  NAD,  A&Ox3   Assessment and Plan: 1. Hypertension, unspecified type - losartan (COZAAR) 25 MG tablet; Take 1 tablet (25 mg total) by mouth daily.  Dispense: 30 tablet; Refill: 3 - metoprolol tartrate (LOPRESSOR) 25 MG tablet; Take 1 tablet (25 mg total) by mouth daily.  Dispense: 30 tablet; Refill: 2 - Ambulatory referral to Cardiology  2. Chest pain, unspecified type Resolved currently ED if returns/changes/worsens - Ambulatory referral to Cardiology  3. Type 2 diabetes mellitus with hyperglycemia, unspecified whether long term insulin use ([place]) Not controlled but should be improving as he has  resumed his medications - glipiZIDE (GLUCOTROL XL) 10 MG 24 hr tablet; Take 1 tablet (10 mg total) by mouth daily with breakfast.  Dispense: 30 tablet; Refill: 3 - metFORMIN (GLUCOPHAGE) 500 MG tablet; Take 2 tablets (1,000 mg total) by mouth 2 (two) times daily with a meal.  Dispense: 60 tablet; Refill: 3  4. H/O myocardial infarction of inferior wall, greater  than 8 weeks Obtain financial packet - clopidogrel (PLAVIX) 75 MG tablet; Take 1 tablet (75 mg total) by mouth daily.  Dispense: 30 tablet; Refill: 3 - Ambulatory referral to Cardiology  5. Hyperlipidemia, unspecified hyperlipidemia type - atorvastatin (LIPITOR) 80 MG tablet; Take 1 tablet (80 mg total) by mouth daily at 6 PM.  Dispense: 30 tablet; Refill: 3   Follow Up Instructions: Assign PCP in 4-6 weeks   I discussed the assessment and treatment plan with the patient. The patient was provided an opportunity to ask questions and all were answered. The patient agreed with the plan and demonstrated an understanding of the instructions.   The patient was advised to call back or seek an in-person evaluation if the symptoms worsen or if the condition fails to improve as anticipated.  I provided 14 minutes of non-face-to-face time during this encounter.   Georgian Co, PA-C

## 2019-12-18 ENCOUNTER — Other Ambulatory Visit: Payer: Self-pay

## 2019-12-18 ENCOUNTER — Ambulatory Visit: Payer: Self-pay | Attending: Family Medicine | Admitting: Physician Assistant

## 2019-12-18 DIAGNOSIS — Z09 Encounter for follow-up examination after completed treatment for conditions other than malignant neoplasm: Secondary | ICD-10-CM

## 2019-12-18 DIAGNOSIS — Z7984 Long term (current) use of oral hypoglycemic drugs: Secondary | ICD-10-CM

## 2019-12-18 DIAGNOSIS — R079 Chest pain, unspecified: Secondary | ICD-10-CM

## 2019-12-18 DIAGNOSIS — E785 Hyperlipidemia, unspecified: Secondary | ICD-10-CM | POA: Insufficient documentation

## 2019-12-18 DIAGNOSIS — E1165 Type 2 diabetes mellitus with hyperglycemia: Secondary | ICD-10-CM | POA: Insufficient documentation

## 2019-12-18 DIAGNOSIS — I252 Old myocardial infarction: Secondary | ICD-10-CM | POA: Insufficient documentation

## 2019-12-18 DIAGNOSIS — I10 Essential (primary) hypertension: Secondary | ICD-10-CM

## 2019-12-18 MED ORDER — LOSARTAN POTASSIUM 25 MG PO TABS: 25.0000 mg | ORAL_TABLET | Freq: Every day | ORAL | 3 refills | Status: DC

## 2019-12-18 MED ORDER — GLIPIZIDE ER 10 MG PO TB24: 10.0000 mg | ORAL_TABLET | Freq: Every day | ORAL | 3 refills | Status: DC

## 2019-12-18 MED ORDER — METOPROLOL TARTRATE 25 MG PO TABS: 25.0000 mg | ORAL_TABLET | Freq: Every day | ORAL | 2 refills | Status: DC

## 2019-12-18 MED ORDER — ATORVASTATIN CALCIUM 80 MG PO TABS: 80.0000 mg | ORAL_TABLET | Freq: Every day | ORAL | 3 refills | Status: DC

## 2019-12-18 MED ORDER — METFORMIN HCL 500 MG PO TABS: 1000.0000 mg | ORAL_TABLET | Freq: Two times a day (BID) | ORAL | 3 refills | Status: DC

## 2019-12-18 MED ORDER — CLOPIDOGREL BISULFATE 75 MG PO TABS: 75.0000 mg | ORAL_TABLET | Freq: Every day | ORAL | 3 refills | Status: DC

## 2019-12-18 MED FILL — METFORMIN HCL 500 MG TABS: 500 | 30 days supply | Qty: 120 | Fill #0

## 2020-01-10 ENCOUNTER — Ambulatory Visit: Payer: Self-pay | Admitting: Cardiology

## 2020-02-03 NOTE — Progress Notes (Deleted)
Cardiology Office Note:    Date:  02/03/2020   ID:  Michael Tyler, DOB 1971/07/27, MRN [id number]  PCP:  Patient, No Pcp Per  Cardiologist:  No primary care provider on file.  Electrophysiologist:  None   Referring MD: Anders Simmonds, PA-C   No chief complaint on file. ***  History of Present Illness:    Michael Tyler is a 49 y.o. male with a hx of CAD status post stent placement in 09/2018 in [place], [place], polysubstance abuse, tobacco use, T2DM, hypertension who is referred by Georgian Co, PA for evaluation of chest pain.  He had a recent ED visit on 12/05/2019 with chest pain.  High-sensitivity troponins were 3 given 2.  Echocardiogram 05/24/2018 showed LVEF 55%.  Past Medical History:  Diagnosis Date  . Diabetes mellitus without complication (HCC)   . Hypertension     No past surgical history on file.  Current Medications: No outpatient medications have been marked as taking for the 02/04/20 encounter (Appointment) with Little Ishikawa, MD.     Allergies:   Peanut oil and Penicillins   Social History   Socioeconomic History  . Marital status: Single    Spouse name: Not on file  . Number of children: Not on file  . Years of education: Not on file  . Highest education level: Not on file  Occupational History  . Not on file  Tobacco Use  . Smoking status: Current Every Day Smoker    Types: Cigarettes  . Smokeless tobacco: Never Used  Vaping Use  . Vaping Use: Never used  Substance and Sexual Activity  . Alcohol use: Not Currently  . Drug use: Not Currently  . Sexual activity: Not on file  Other Topics Concern  . Not on file  Social History Narrative  . Not on file   Social Determinants of Health   Financial Resource Strain:   . Difficulty of Paying Living Expenses:   Food Insecurity:   . Worried About Programme researcher, broadcasting/film/video in the Last Year:   . Barista in the Last Year:   Transportation Needs:   . Freight forwarder (Medical):   Marland Kitchen  Lack of Transportation (Non-Medical):   Physical Activity:   . Days of Exercise per Week:   . Minutes of Exercise per Session:   Stress:   . Feeling of Stress :   Social Connections:   . Frequency of Communication with Friends and Family:   . Frequency of Social Gatherings with Friends and Family:   . Attends Religious Services:   . Active Member of Clubs or Organizations:   . Attends Banker Meetings:   Marland Kitchen Marital Status:      Family History: The patient's ***family history is not on file.  ROS:   Please see the history of present illness.    *** All other systems reviewed and are negative.  EKGs/Labs/Other Studies Reviewed:    The following studies were reviewed today: ***  EKG:  EKG is *** ordered today.  The ekg ordered today demonstrates ***  Recent Labs: 12/05/2019: BUN 10; Creatinine, Ser 0.87; Hemoglobin 12.3; Platelets 247; Potassium 4.1; Sodium 138  Recent Lipid Panel No results found for: CHOL, TRIG, HDL, CHOLHDL, VLDL, LDLCALC, LDLDIRECT  Physical Exam:    VS:  There were no vitals taken for this visit.    Wt Readings from Last 3 Encounters:  12/05/19 195 lb (88.5 kg)  10/03/19 195 lb (88.5 kg)  08/16/19 210  lb (95.3 kg)     GEN: *** Well nourished, well developed in no acute distress HEENT: Normal NECK: No JVD; No carotid bruits LYMPHATICS: No lymphadenopathy CARDIAC: ***RRR, no murmurs, rubs, gallops RESPIRATORY:  Clear to auscultation without rales, wheezing or rhonchi  ABDOMEN: Soft, non-tender, non-distended MUSCULOSKELETAL:  No edema; No deformity  SKIN: Warm and dry NEUROLOGIC:  Alert and oriented x 3 PSYCHIATRIC:  Normal affect   ASSESSMENT:    No diagnosis found. PLAN:    CAD: Status post PCI in February 2020 in [place]. -Continue aspirin, Plavix, statin -We will obtain records of prior catheterization  Hypertension: On losartan 25 mg daily and metoprolol 25 mg daily  Hyperlipidemia: On atorvastatin 10  mg daily  T2DM: On Metformin and glipizide  RTC in***  Medication Adjustments/Labs and Tests Ordered: Current medicines are reviewed at length with the patient today.  Concerns regarding medicines are outlined above.  No orders of the defined types were placed in this encounter.  No orders of the defined types were placed in this encounter.   There are no Patient Instructions on file for this visit.   Signed, Little Ishikawa, MD  02/03/2020 9:23 PM    [facility]

## 2020-02-04 ENCOUNTER — Ambulatory Visit: Payer: Self-pay | Admitting: Cardiology

## 2020-02-04 ENCOUNTER — Ambulatory Visit: Payer: Self-pay | Admitting: Internal Medicine

## 2020-02-10 ENCOUNTER — Encounter: Payer: Self-pay | Admitting: General Practice

## 2020-02-27 ENCOUNTER — Emergency Department (HOSPITAL_COMMUNITY)
Admission: EM | Admit: 2020-02-27 | Discharge: 2020-02-27 | Disposition: A | Discharge status: Home / Self Care | Payer: Self-pay | Attending: Emergency Medicine | Admitting: Emergency Medicine

## 2020-02-27 ENCOUNTER — Other Ambulatory Visit: Payer: Self-pay

## 2020-02-27 ENCOUNTER — Emergency Department (HOSPITAL_COMMUNITY): Payer: Self-pay

## 2020-02-27 ENCOUNTER — Encounter (HOSPITAL_COMMUNITY): Payer: Self-pay

## 2020-02-27 DIAGNOSIS — E785 Hyperlipidemia, unspecified: Secondary | ICD-10-CM

## 2020-02-27 DIAGNOSIS — I252 Old myocardial infarction: Secondary | ICD-10-CM

## 2020-02-27 DIAGNOSIS — Z7982 Long term (current) use of aspirin: Secondary | ICD-10-CM | POA: Insufficient documentation

## 2020-02-27 DIAGNOSIS — Z91010 Allergy to peanuts: Secondary | ICD-10-CM | POA: Insufficient documentation

## 2020-02-27 DIAGNOSIS — E1165 Type 2 diabetes mellitus with hyperglycemia: Secondary | ICD-10-CM | POA: Insufficient documentation

## 2020-02-27 DIAGNOSIS — R0789 Other chest pain: Secondary | ICD-10-CM | POA: Insufficient documentation

## 2020-02-27 DIAGNOSIS — I10 Essential (primary) hypertension: Secondary | ICD-10-CM | POA: Insufficient documentation

## 2020-02-27 DIAGNOSIS — F17210 Nicotine dependence, cigarettes, uncomplicated: Secondary | ICD-10-CM | POA: Insufficient documentation

## 2020-02-27 DIAGNOSIS — Z79899 Other long term (current) drug therapy: Secondary | ICD-10-CM | POA: Insufficient documentation

## 2020-02-27 DIAGNOSIS — Z7984 Long term (current) use of oral hypoglycemic drugs: Secondary | ICD-10-CM | POA: Insufficient documentation

## 2020-02-27 HISTORY — DX: Accidental discharge from unspecified firearms or gun, initial encounter: W34.00XA

## 2020-02-27 HISTORY — DX: Acute myocardial infarction, unspecified: I21.9

## 2020-02-27 HISTORY — DX: Unspecified firearm discharge, undetermined intent, initial encounter: Y24.9XXA

## 2020-02-27 LAB — BASIC METABOLIC PANEL
Anion gap: 10 (ref 5–15)
BUN: 15 mg/dL (ref 6–20)
CO2: 25 mmol/L (ref 22–32)
Calcium: 8.8 mg/dL — ABNORMAL LOW (ref 8.9–10.3)
Chloride: 106 mmol/L (ref 98–111)
Creatinine, Ser: 0.85 mg/dL (ref 0.61–1.24)
GFR calc Af Amer: >60 mL/min (ref >60)
GFR calc non Af Amer: >60 mL/min (ref >60)
Glucose, Bld: 284 mg/dL — ABNORMAL HIGH (ref 70–99)
Potassium: 3.9 mmol/L (ref 3.5–5.1)
Sodium: 141 mmol/L (ref 135–145)

## 2020-02-27 LAB — CBG MONITORING, ED: Glucose-Capillary: 286 mg/dL — ABNORMAL HIGH (ref 70–99)

## 2020-02-27 LAB — CBC
HCT: 38.4 % — ABNORMAL LOW (ref 39.0–52.0)
Hemoglobin: 11.9 g/dL — ABNORMAL LOW (ref 13.0–17.0)
MCH: 28.5 pg (ref 26.0–34.0)
MCHC: 31 g/dL (ref 30.0–36.0)
MCV: 92.1 fL (ref 80.0–100.0)
Platelets: 240 10*3/uL (ref 150–400)
RBC: 4.17 MIL/uL — ABNORMAL LOW (ref 4.22–5.81)
RDW: 13.6 % (ref 11.5–15.5)
WBC: 8.9 10*3/uL (ref 4.0–10.5)
nRBC: 0 % (ref 0.0–0.2)

## 2020-02-27 LAB — TROPONIN I (HIGH SENSITIVITY)
Troponin I (High Sensitivity): <2 ng/L (ref <18)
Troponin I (High Sensitivity): 2 ng/L (ref <18)

## 2020-02-27 MED ORDER — LACTATED RINGERS IV BOLUS
1000.0000 mL | Freq: Once | INTRAVENOUS | Status: AC
  Administered 2020-02-27: 1000 mL via INTRAVENOUS

## 2020-02-27 MED ORDER — METOPROLOL TARTRATE 25 MG PO TABS: 25.0000 mg | ORAL_TABLET | Freq: Every day | ORAL | 0 refills | Status: DC

## 2020-02-27 MED ORDER — METFORMIN HCL 500 MG PO TABS: 1000.0000 mg | ORAL_TABLET | Freq: Two times a day (BID) | ORAL | 0 refills | Status: DC

## 2020-02-27 MED ORDER — CLOPIDOGREL BISULFATE 75 MG PO TABS: 75.0000 mg | ORAL_TABLET | Freq: Every day | ORAL | 0 refills | Status: AC

## 2020-02-27 MED ORDER — SODIUM CHLORIDE 0.9% FLUSH: 3.0000 mL | Freq: Once | INTRAVENOUS | Status: DC

## 2020-02-27 MED ORDER — LOSARTAN POTASSIUM 25 MG PO TABS: 25.0000 mg | ORAL_TABLET | Freq: Every day | ORAL | 0 refills | Status: DC

## 2020-02-27 MED ORDER — ASPIRIN 81 MG PO TBEC: 81.0000 mg | DELAYED_RELEASE_TABLET | Freq: Every day | ORAL | 0 refills | Status: AC

## 2020-02-27 MED ORDER — GLIPIZIDE ER 10 MG PO TB24: 10.0000 mg | ORAL_TABLET | Freq: Every day | ORAL | 0 refills | Status: DC

## 2020-02-27 MED ORDER — ATORVASTATIN CALCIUM 80 MG PO TABS: 80.0000 mg | ORAL_TABLET | Freq: Every day | ORAL | 0 refills | Status: DC

## 2020-02-27 NOTE — Progress Notes (Addendum)
TOC CM spoke to pt and states he is in the program, [address] [facility] [place] here in [place]. He is established with [facility] and uses that pharmacy for his meds. Will arrange appt with [facility] on tomorrow and call give pt the appt time. Pt will pick up meds tomorrow from [facility]. ED provider updated. Isidoro Donning RN CCM, [facility] ED TOC CM [phone]

## 2020-02-27 NOTE — Discharge Instructions (Addendum)
Thank you for allowing [place] to care for you today! Please follow up with the primary care provider that transitional care discussed with you as well as follow up with your cardiologist. Please take your medications as prescribed.   If you have any worsening of symptoms or new symptoms, please return to your closest ED.
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
## 2020-02-27 NOTE — ED Triage Notes (Signed)
Patient c/o SOB, chest pain, and hypoglycemia since yesterday. Patient's CBG in triage 286.

## 2020-03-03 MED FILL — METFORMIN HCL 500 MG TABS: 500 | 15 days supply | Qty: 60 | Fill #0 | Status: TO

## 2020-03-03 MED FILL — ?METOPROLOL TART 25MG TABLE: 25 | 30 days supply | Qty: 30 | Fill #0 | Status: TO

## 2020-03-03 MED FILL — ?CLOPIDOGREL 75MG TA: 75 | 30 days supply | Qty: 30 | Fill #0 | Status: TO

## 2020-03-03 MED FILL — LOSARTAN POTASSIUM 25 MG TA: 25 | 30 days supply | Qty: 30 | Fill #0 | Status: TO

## 2020-03-03 MED FILL — ATORVASTATIN 80 MG TABLET: 80 | 30 days supply | Qty: 30 | Fill #0 | Status: TO

## 2020-03-03 MED FILL — glipiZIDE XL 10 MG TB24: 10 | 30 days supply | Qty: 30 | Fill #0 | Status: TO

## 2020-04-08 ENCOUNTER — Ambulatory Visit (INDEPENDENT_AMBULATORY_CARE_PROVIDER_SITE_OTHER): Payer: Self-pay | Admitting: Primary Care

## 2020-04-24 ENCOUNTER — Other Ambulatory Visit (INDEPENDENT_AMBULATORY_CARE_PROVIDER_SITE_OTHER): Payer: Self-pay | Admitting: Primary Care

## 2020-04-24 ENCOUNTER — Encounter (INDEPENDENT_AMBULATORY_CARE_PROVIDER_SITE_OTHER): Payer: Self-pay | Admitting: Primary Care

## 2020-04-24 ENCOUNTER — Other Ambulatory Visit: Payer: Self-pay

## 2020-04-24 ENCOUNTER — Ambulatory Visit (INDEPENDENT_AMBULATORY_CARE_PROVIDER_SITE_OTHER): Payer: Self-pay | Admitting: Primary Care

## 2020-04-24 VITALS — BP 143/87 | HR 63 | Temp 97.5°F | Ht 70.0 in | Wt 200.8 lb

## 2020-04-24 DIAGNOSIS — I10 Essential (primary) hypertension: Secondary | ICD-10-CM

## 2020-04-24 DIAGNOSIS — E785 Hyperlipidemia, unspecified: Secondary | ICD-10-CM

## 2020-04-24 DIAGNOSIS — E1165 Type 2 diabetes mellitus with hyperglycemia: Secondary | ICD-10-CM

## 2020-04-24 LAB — POCT GLYCOSYLATED HEMOGLOBIN (HGB A1C): Hemoglobin A1C: 8.5 % — AB (ref 4.0–5.6)

## 2020-04-24 LAB — GLUCOSE, POCT (MANUAL RESULT ENTRY): POC Glucose: 156 mg/dl — AB (ref 70–99)

## 2020-04-24 MED ORDER — METOPROLOL TARTRATE 25 MG PO TABS
25.0000 mg | ORAL_TABLET | Freq: Every day | ORAL | 1 refills | Status: AC
  Filled 2021-01-20: qty 90, 90d supply, fill #0

## 2020-04-24 MED ORDER — METFORMIN HCL 1000 MG PO TABS
1000.0000 mg | ORAL_TABLET | Freq: Two times a day (BID) | ORAL | 1 refills | Status: AC
Start: 2020-04-24 — End: ?
  Filled 2021-01-21: qty 180, 90d supply, fill #0

## 2020-04-24 MED ORDER — LOSARTAN POTASSIUM 25 MG PO TABS
25.0000 mg | ORAL_TABLET | Freq: Every day | ORAL | 1 refills | Status: AC
  Filled 2021-01-20: qty 90, 90d supply, fill #0

## 2020-04-24 MED ORDER — GLIPIZIDE 10 MG PO TABS
10.0000 mg | ORAL_TABLET | Freq: Two times a day (BID) | ORAL | 1 refills | Status: AC
  Filled 2021-01-20: qty 180, 90d supply, fill #0

## 2020-04-24 MED ORDER — ATORVASTATIN CALCIUM 80 MG PO TABS: 80.0000 mg | ORAL_TABLET | Freq: Every day | ORAL | 0 refills | Status: AC

## 2020-04-24 MED FILL — METFORMIN HCL 1000 MG TABS: 1000 | 90 days supply | Qty: 180 | Fill #0

## 2020-04-24 MED FILL — LOSARTAN POTASSIUM 25 MG TA: 25 | 90 days supply | Qty: 90 | Fill #0

## 2020-04-24 MED FILL — METOPROLOL TARTRATE 25 MG T: 25 | 90 days supply | Qty: 90 | Fill #0

## 2020-04-24 MED FILL — ATORVASTATIN CALCIUM 80 MG: 80 | 90 days supply | Qty: 90 | Fill #0

## 2020-04-24 MED FILL — glipiZIDE 10 MG TABS: 10 | 90 days supply | Qty: 180 | Fill #0

## 2020-04-24 NOTE — Telephone Encounter (Signed)
Medication Refill - Medication: Atorvastatin 80 mg 90 days  Has the patient contacted their pharmacy? Yes.   (Agent: If no, request that the patient contact the pharmacy for the refill.) (Agent: If yes, when and what did the pharmacy advise?)  Preferred Pharmacy (with phone number or street name): [facility]  Agent: Please be advised that RX refills may take up to 3 business days. We ask that you follow-up with your pharmacy.
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
## 2020-04-24 NOTE — Patient Instructions (Signed)
Diabetes Mellitus and Exercise Exercising regularly is important for your overall health, especially when you have diabetes (diabetes mellitus). Exercising is not only about losing weight. It has many other health benefits, such as increasing muscle strength and bone density and reducing body fat and stress. This leads to improved fitness, flexibility, and endurance, all of which result in better overall health. Exercise has additional benefits for people with diabetes, including:  Reducing appetite.  Helping to lower and control blood glucose.  Lowering blood pressure.  Helping to control amounts of fatty substances (lipids) in the blood, such as cholesterol and triglycerides.  Helping the body to respond better to insulin (improving insulin sensitivity).  Reducing how much insulin the body needs.  Decreasing the risk for heart disease by: ? Lowering cholesterol and triglyceride levels. ? Increasing the levels of good cholesterol. ? Lowering blood glucose levels. What is my activity plan? Your health care provider or certified diabetes educator can help you make a plan for the type and frequency of exercise (activity plan) that works for you. Make sure that you:  Do at least 150 minutes of moderate-intensity or vigorous-intensity exercise each week. This could be brisk walking, biking, or water aerobics. ? Do stretching and strength exercises, such as yoga or weightlifting, at least 2 times a week. ? Spread out your activity over at least 3 days of the week.  Get some form of physical activity every day. ? Do not go more than 2 days in a row without some kind of physical activity. ? Avoid being inactive for more than 30 minutes at a time. Take frequent breaks to walk or stretch.  Choose a type of exercise or activity that you enjoy, and set realistic goals.  Start slowly, and gradually increase the intensity of your exercise over time. What do I need to know about managing my  diabetes?   Check your blood glucose before and after exercising. ? If your blood glucose is 240 mg/dL (13.3 mmol/L) or higher before you exercise, check your urine for ketones. If you have ketones in your urine, do not exercise until your blood glucose returns to normal. ? If your blood glucose is 100 mg/dL (5.6 mmol/L) or lower, eat a snack containing 15-20 grams of carbohydrate. Check your blood glucose 15 minutes after the snack to make sure that your level is above 100 mg/dL (5.6 mmol/L) before you start your exercise.  Know the symptoms of low blood glucose (hypoglycemia) and how to treat it. Your risk for hypoglycemia increases during and after exercise. Common symptoms of hypoglycemia can include: ? Hunger. ? Anxiety. ? Sweating and feeling clammy. ? Confusion. ? Dizziness or feeling light-headed. ? Increased heart rate or palpitations. ? Blurry vision. ? Tingling or numbness around the mouth, lips, or tongue. ? Tremors or shakes. ? Irritability.  Keep a rapid-acting carbohydrate snack available before, during, and after exercise to help prevent or treat hypoglycemia.  Avoid injecting insulin into areas of the body that are going to be exercised. For example, avoid injecting insulin into: ? The arms, when playing tennis. ? The legs, when jogging.  Keep records of your exercise habits. Doing this can help you and your health care provider adjust your diabetes management plan as needed. Write down: ? Food that you eat before and after you exercise. ? Blood glucose levels before and after you exercise. ? The type and amount of exercise you have done. ? When your insulin is expected to peak, if you use   insulin. Avoid exercising at times when your insulin is peaking.  When you start a new exercise or activity, work with your health care provider to make sure the activity is safe for you, and to adjust your insulin, medicines, or food intake as needed.  Drink plenty of water while  you exercise to prevent dehydration or heat stroke. Drink enough fluid to keep your urine clear or pale yellow. Summary  Exercising regularly is important for your overall health, especially when you have diabetes (diabetes mellitus).  Exercising has many health benefits, such as increasing muscle strength and bone density and reducing body fat and stress.  Your health care provider or certified diabetes educator can help you make a plan for the type and frequency of exercise (activity plan) that works for you.  When you start a new exercise or activity, work with your health care provider to make sure the activity is safe for you, and to adjust your insulin, medicines, or food intake as needed. This information is not intended to replace advice given to you by your health care provider. Make sure you discuss any questions you have with your health care provider. Document Revised: 02/09/2017 Document Reviewed: 12/28/2015 Elsevier Patient Education  2020 [employer]  

## 2020-06-29 ENCOUNTER — Ambulatory Visit (INDEPENDENT_AMBULATORY_CARE_PROVIDER_SITE_OTHER): Payer: Self-pay | Admitting: Primary Care

## 2020-09-09 ENCOUNTER — Emergency Department (HOSPITAL_COMMUNITY)
Admission: EM | Admit: 2020-09-09 | Discharge: 2020-09-09 | Disposition: A | Discharge status: Home / Self Care | Payer: Self-pay | Attending: Emergency Medicine | Admitting: Emergency Medicine

## 2020-09-09 ENCOUNTER — Other Ambulatory Visit: Payer: Self-pay

## 2020-09-09 ENCOUNTER — Encounter (HOSPITAL_COMMUNITY): Payer: Self-pay

## 2020-09-09 DIAGNOSIS — X500XXA Overexertion from strenuous movement or load, initial encounter: Secondary | ICD-10-CM | POA: Insufficient documentation

## 2020-09-09 DIAGNOSIS — Z79899 Other long term (current) drug therapy: Secondary | ICD-10-CM | POA: Insufficient documentation

## 2020-09-09 DIAGNOSIS — F17210 Nicotine dependence, cigarettes, uncomplicated: Secondary | ICD-10-CM | POA: Insufficient documentation

## 2020-09-09 DIAGNOSIS — E119 Type 2 diabetes mellitus without complications: Secondary | ICD-10-CM | POA: Insufficient documentation

## 2020-09-09 DIAGNOSIS — Z7984 Long term (current) use of oral hypoglycemic drugs: Secondary | ICD-10-CM | POA: Insufficient documentation

## 2020-09-09 DIAGNOSIS — Y990 Civilian activity done for income or pay: Secondary | ICD-10-CM | POA: Insufficient documentation

## 2020-09-09 DIAGNOSIS — Z91010 Allergy to peanuts: Secondary | ICD-10-CM | POA: Insufficient documentation

## 2020-09-09 DIAGNOSIS — I10 Essential (primary) hypertension: Secondary | ICD-10-CM | POA: Insufficient documentation

## 2020-09-09 DIAGNOSIS — M545 Low back pain, unspecified: Secondary | ICD-10-CM | POA: Insufficient documentation

## 2020-09-09 MED ORDER — TIZANIDINE HCL 4 MG PO TABS: 4.0000 mg | ORAL_TABLET | Freq: Four times a day (QID) | ORAL | 0 refills | Status: AC | PRN

## 2020-09-09 MED ORDER — ACETAMINOPHEN 325 MG PO TABS
650.0000 mg | ORAL_TABLET | Freq: Once | ORAL | Status: AC
  Administered 2020-09-09: 650 mg via ORAL
  Filled 2020-09-09: qty 2

## 2020-09-09 MED ORDER — LIDOCAINE 4 % EX PTCH: MEDICATED_PATCH | CUTANEOUS | 0 refills | Status: AC
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
## 2020-09-09 NOTE — ED Triage Notes (Signed)
Pt presents with c/o back injury. Pt reports that he feels like he pulled a muscle in his back at work yesterday. Pt ambulatory to triage.

## 2020-09-09 NOTE — Discharge Instructions (Signed)
Call your primary care doctor or specialist as discussed in the next 2-3 days.   Return immediately back to the ER if:  Your symptoms worsen within the next 12-24 hours. You develop new symptoms such as new fevers, persistent vomiting, new pain, shortness of breath, or new weakness or numbness, or if you have any other concerns.  

## 2021-01-20 ENCOUNTER — Other Ambulatory Visit: Payer: Self-pay

## 2021-01-21 ENCOUNTER — Other Ambulatory Visit: Payer: Self-pay

## 2021-01-26 ENCOUNTER — Other Ambulatory Visit: Payer: Self-pay

## 2021-03-29 ENCOUNTER — Other Ambulatory Visit (HOSPITAL_COMMUNITY): Payer: Self-pay

## 2022-01-20 IMAGING — CR DG CHEST 2V
2 series · 2 of 2 positions shown · non-contrast
Comparison: 12/05/2019

CLINICAL DATA: Chest pain and shortness of breath.

EXAM:
CHEST - 2 VIEW

[w chest pa]
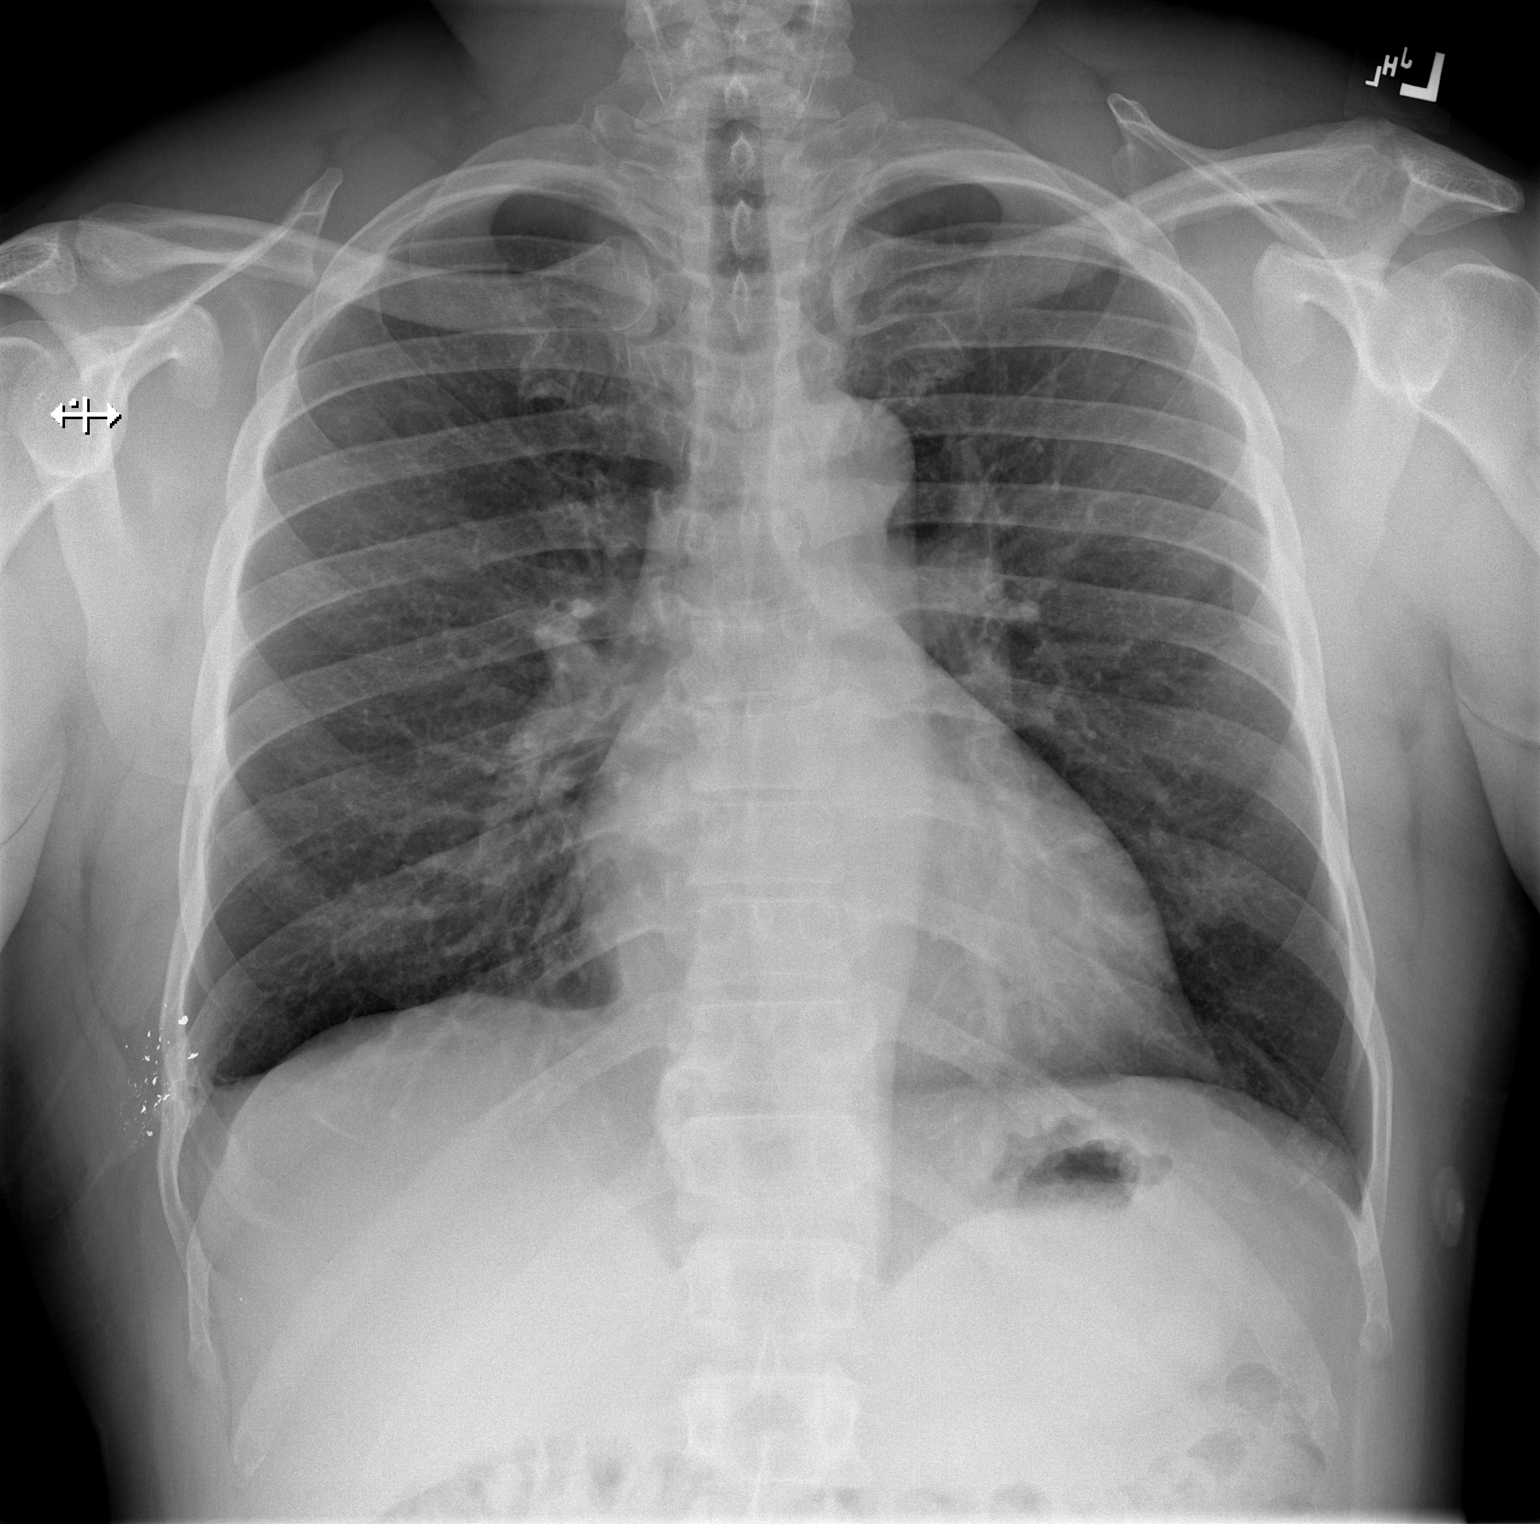

[w chest lat]
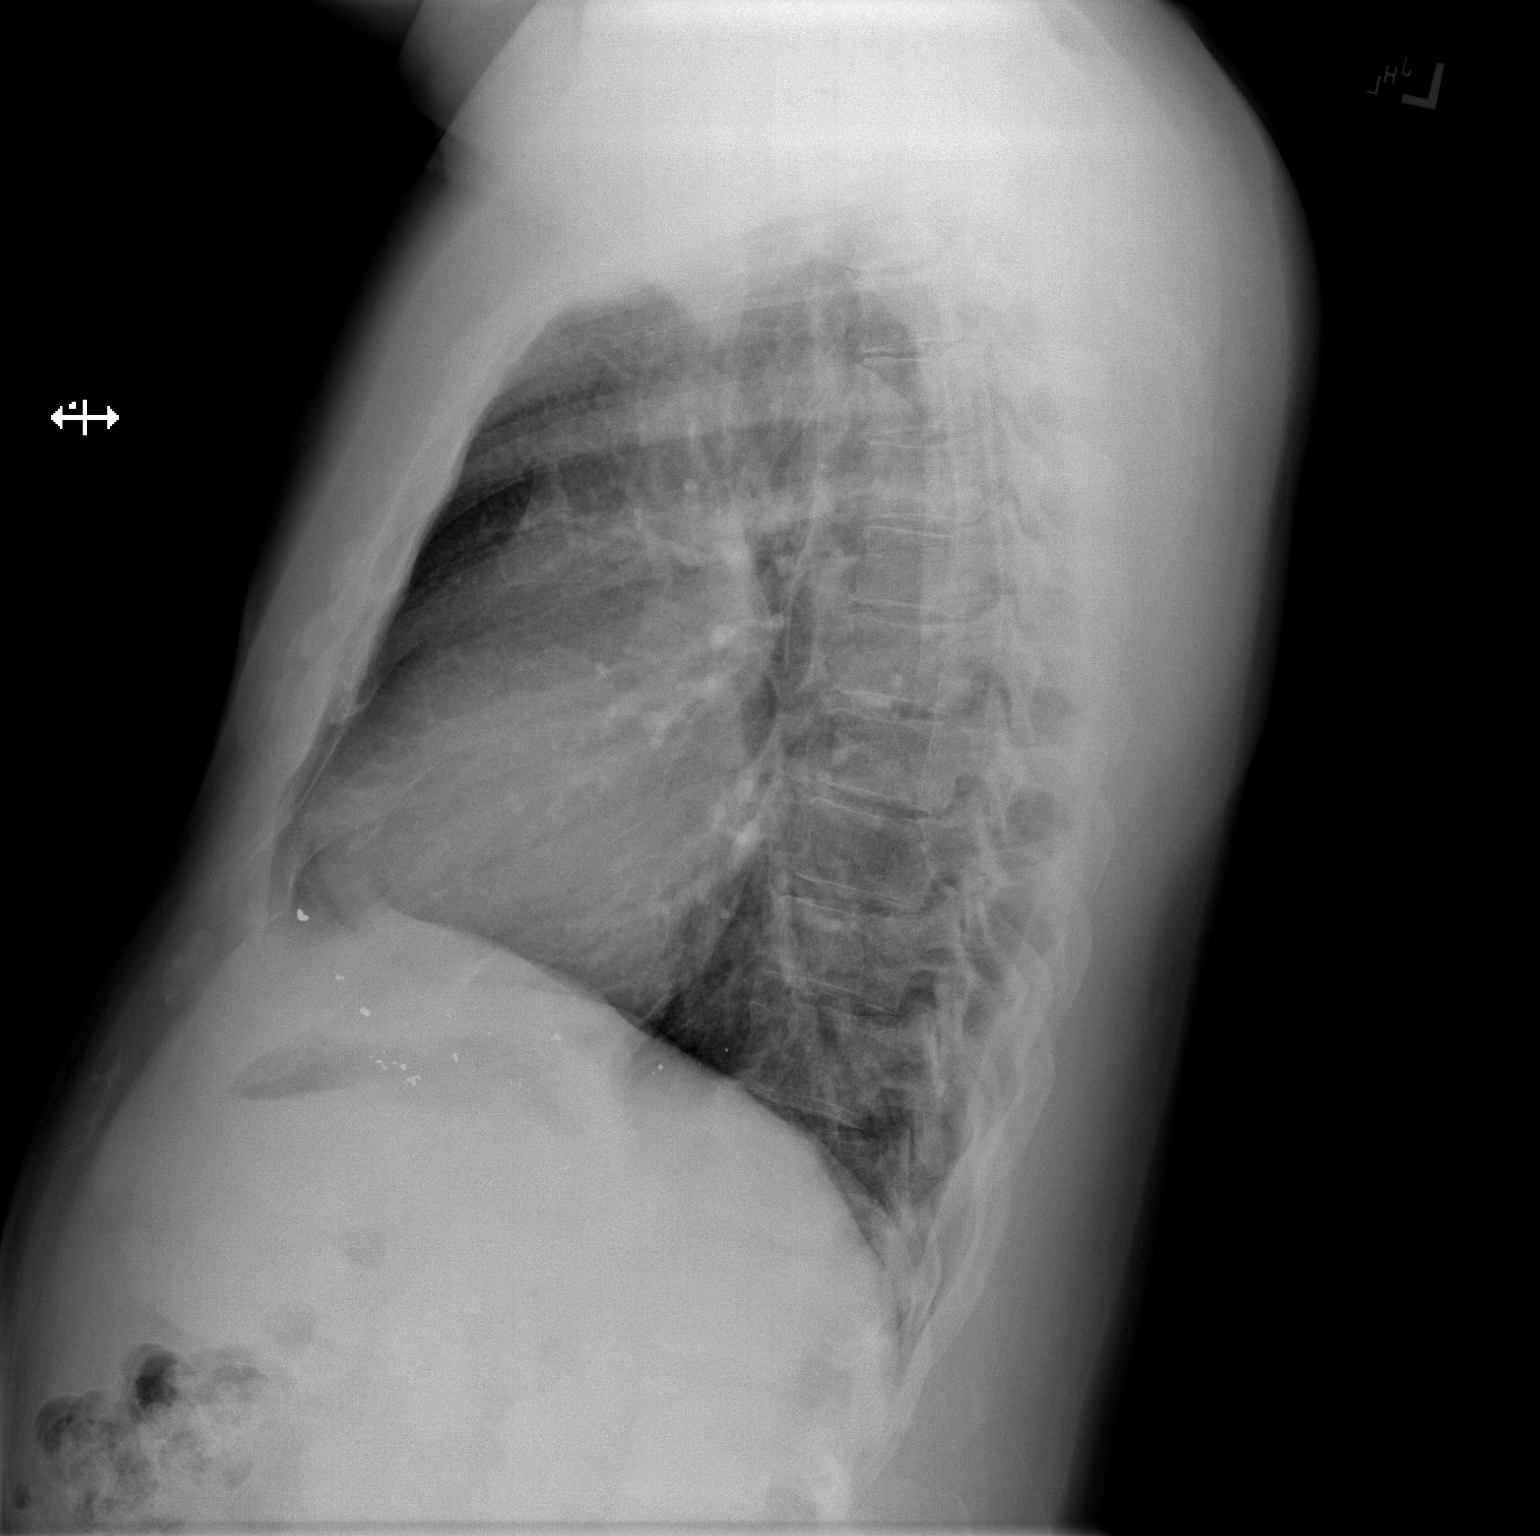

[2 of 2 positions shown; findings below may reference images not displayed]

FINDINGS: The cardiomediastinal contours are normal. Pulmonary vasculature is
normal. No consolidation, pleural effusion, or pneumothorax.
Metallic densities typical of [place] over
the right lateral chest wall with remote fracture of the ninth rib.
Mild adjacent scarring. No acute osseous abnormalities are seen.
IMPRESSION: No acute chest findings.
